# Patient Record
Sex: Male | Born: 1955 | Race: White | Hispanic: No | Marital: Married | Smoking: Never smoker
Health system: Southern US, Community
[De-identification: ages and names within clinical notes are randomized; demographics above are authoritative.]

## PROBLEM LIST (undated history)

## (undated) DIAGNOSIS — R0602 Shortness of breath: Secondary | ICD-10-CM

## (undated) DIAGNOSIS — I10 Essential (primary) hypertension: Secondary | ICD-10-CM

## (undated) DIAGNOSIS — R413 Other amnesia: Secondary | ICD-10-CM

## (undated) DIAGNOSIS — E119 Type 2 diabetes mellitus without complications: Secondary | ICD-10-CM

## (undated) HISTORY — DX: Essential (primary) hypertension: I10

## (undated) HISTORY — PX: KNEE CARTILAGE SURGERY: SHX688

## (undated) HISTORY — DX: Type 2 diabetes mellitus without complications: E11.9

## (undated) HISTORY — DX: Shortness of breath: R06.02

## (undated) HISTORY — DX: Other amnesia: R41.3

---

## 2001-01-14 ENCOUNTER — Inpatient Hospital Stay (HOSPITAL_COMMUNITY): Admission: AD | Admit: 2001-01-14 | Discharge: 2001-01-17 | Payer: Self-pay | Admitting: Neurosurgery

## 2002-11-17 ENCOUNTER — Ambulatory Visit (HOSPITAL_COMMUNITY): Admission: RE | Admit: 2002-11-17 | Discharge: 2002-11-17 | Payer: Self-pay | Admitting: Family Medicine

## 2007-11-18 ENCOUNTER — Ambulatory Visit (HOSPITAL_BASED_OUTPATIENT_CLINIC_OR_DEPARTMENT_OTHER): Admission: RE | Admit: 2007-11-18 | Discharge: 2007-11-18 | Payer: Self-pay | Admitting: Family Medicine

## 2007-12-29 ENCOUNTER — Encounter: Admission: RE | Admit: 2007-12-29 | Discharge: 2007-12-29 | Payer: Self-pay | Admitting: Cardiology

## 2007-12-30 ENCOUNTER — Inpatient Hospital Stay (HOSPITAL_BASED_OUTPATIENT_CLINIC_OR_DEPARTMENT_OTHER): Admission: RE | Admit: 2007-12-30 | Discharge: 2007-12-30 | Payer: Self-pay | Admitting: Cardiology

## 2008-03-25 ENCOUNTER — Encounter: Admission: RE | Admit: 2008-03-25 | Discharge: 2008-03-25 | Payer: Self-pay | Admitting: Family Medicine

## 2008-11-06 ENCOUNTER — Encounter: Admission: RE | Admit: 2008-11-06 | Discharge: 2008-11-06 | Payer: Self-pay | Admitting: Emergency Medicine

## 2009-06-14 ENCOUNTER — Emergency Department (HOSPITAL_COMMUNITY): Admission: EM | Admit: 2009-06-14 | Discharge: 2009-06-14 | Payer: Self-pay | Admitting: Emergency Medicine

## 2009-09-16 ENCOUNTER — Emergency Department (HOSPITAL_COMMUNITY): Admission: EM | Admit: 2009-09-16 | Discharge: 2009-09-16 | Payer: Self-pay | Admitting: Emergency Medicine

## 2010-01-24 ENCOUNTER — Ambulatory Visit (HOSPITAL_COMMUNITY)
Admission: RE | Admit: 2010-01-24 | Discharge: 2010-01-25 | Payer: Self-pay | Source: Home / Self Care | Attending: Urology | Admitting: Urology

## 2010-03-02 ENCOUNTER — Encounter: Payer: Self-pay | Admitting: Family Medicine

## 2010-04-21 LAB — COMPREHENSIVE METABOLIC PANEL WITH GFR
ALT: 28 U/L (ref 0–53)
AST: 24 U/L (ref 0–37)
Albumin: 3.7 g/dL (ref 3.5–5.2)
Alkaline Phosphatase: 81 U/L (ref 39–117)
BUN: 11 mg/dL (ref 6–23)
CO2: 29 meq/L (ref 19–32)
Calcium: 9.1 mg/dL (ref 8.4–10.5)
Chloride: 99 meq/L (ref 96–112)
Creatinine, Ser: 1.07 mg/dL (ref 0.4–1.5)
GFR calc non Af Amer: >60 mL/min
Glucose, Bld: 307 mg/dL — ABNORMAL HIGH (ref 70–99)
Potassium: 4.7 meq/L (ref 3.5–5.1)
Sodium: 138 meq/L (ref 135–145)
Total Bilirubin: 0.7 mg/dL (ref 0.3–1.2)
Total Protein: 7.1 g/dL (ref 6.0–8.3)

## 2010-04-21 LAB — GLUCOSE, CAPILLARY
Glucose-Capillary: 213 mg/dL — ABNORMAL HIGH (ref 70–99)
Glucose-Capillary: 215 mg/dL — ABNORMAL HIGH (ref 70–99)
Glucose-Capillary: 227 mg/dL — ABNORMAL HIGH (ref 70–99)
Glucose-Capillary: 257 mg/dL — ABNORMAL HIGH (ref 70–99)
Glucose-Capillary: 290 mg/dL — ABNORMAL HIGH (ref 70–99)

## 2010-04-21 LAB — SURGICAL PCR SCREEN
MRSA, PCR: NEGATIVE
Staphylococcus aureus: NEGATIVE

## 2010-04-21 LAB — CBC
MCH: 30.3 pg (ref 26.0–34.0)
MCV: 87.9 fL (ref 78.0–100.0)
RBC: 5.12 MIL/uL (ref 4.22–5.81)

## 2010-04-25 LAB — COMPREHENSIVE METABOLIC PANEL
ALT: 32 U/L (ref 0–53)
AST: 28 U/L (ref 0–37)
Alkaline Phosphatase: 86 U/L (ref 39–117)
CO2: 25 mEq/L (ref 19–32)
Creatinine, Ser: 1.06 mg/dL (ref 0.4–1.5)
GFR calc Af Amer: >60 mL/min (ref >60)
Glucose, Bld: 245 mg/dL — ABNORMAL HIGH (ref 70–99)
Total Bilirubin: 0.8 mg/dL (ref 0.3–1.2)

## 2010-04-25 LAB — URINE MICROSCOPIC-ADD ON: Urine-Other: NONE SEEN

## 2010-04-25 LAB — URINALYSIS, ROUTINE W REFLEX MICROSCOPIC
Bilirubin Urine: NEGATIVE
Hgb urine dipstick: NEGATIVE
Ketones, ur: NEGATIVE mg/dL
Leukocytes, UA: NEGATIVE
Nitrite: NEGATIVE
Protein, ur: NEGATIVE mg/dL
Urobilinogen, UA: 0.2 mg/dL (ref 0.0–1.0)
pH: 6 (ref 5.0–8.0)

## 2010-04-25 LAB — DIFFERENTIAL
Basophils Relative: 0 % (ref 0–1)
Lymphocytes Relative: 21 % (ref 12–46)
Monocytes Absolute: 0.4 10*3/uL (ref 0.1–1.0)
Neutro Abs: 2.9 10*3/uL (ref 1.7–7.7)

## 2010-04-25 LAB — GLUCOSE, CAPILLARY: Glucose-Capillary: 248 mg/dL — ABNORMAL HIGH (ref 70–99)

## 2010-04-25 LAB — CBC
MCH: 32.2 pg (ref 26.0–34.0)
RBC: 4.67 MIL/uL (ref 4.22–5.81)

## 2010-04-29 LAB — CK TOTAL AND CKMB (NOT AT ARMC)
CK, MB: 3.3 ng/mL (ref 0.3–4.0)
CK, MB: 3.5 ng/mL (ref 0.3–4.0)
Relative Index: 2.4 (ref 0.0–2.5)

## 2010-04-29 LAB — COMPREHENSIVE METABOLIC PANEL
Albumin: 3.7 g/dL (ref 3.5–5.2)
Alkaline Phosphatase: 77 U/L (ref 39–117)
BUN: 16 mg/dL (ref 6–23)
Chloride: 100 mEq/L (ref 96–112)
Creatinine, Ser: 1.3 mg/dL (ref 0.4–1.5)
GFR calc non Af Amer: 57 mL/min — ABNORMAL LOW (ref >60)
Glucose, Bld: 229 mg/dL — ABNORMAL HIGH (ref 70–99)
Sodium: 137 mEq/L (ref 135–145)
Total Bilirubin: 1 mg/dL (ref 0.3–1.2)
Total Protein: 7.2 g/dL (ref 6.0–8.3)

## 2010-04-29 LAB — CBC
HCT: 45.2 % (ref 39.0–52.0)
Hemoglobin: 15.8 g/dL (ref 13.0–17.0)
MCHC: 34.9 g/dL (ref 30.0–36.0)
RBC: 4.97 MIL/uL (ref 4.22–5.81)
RDW: 13.6 % (ref 11.5–15.5)
WBC: 6.1 10*3/uL (ref 4.0–10.5)

## 2010-04-29 LAB — URINALYSIS, ROUTINE W REFLEX MICROSCOPIC
Hgb urine dipstick: NEGATIVE
Specific Gravity, Urine: 1.046 — ABNORMAL HIGH (ref 1.005–1.030)
pH: 5 (ref 5.0–8.0)

## 2010-04-29 LAB — DIFFERENTIAL
Basophils Absolute: 0 10*3/uL (ref 0.0–0.1)
Eosinophils Absolute: 0.2 10*3/uL (ref 0.0–0.7)
Monocytes Absolute: 0.7 10*3/uL (ref 0.1–1.0)

## 2010-04-29 LAB — RAPID URINE DRUG SCREEN, HOSP PERFORMED
Barbiturates: NOT DETECTED
Benzodiazepines: NOT DETECTED
Cocaine: NOT DETECTED
Tetrahydrocannabinol: NOT DETECTED

## 2010-04-29 LAB — TROPONIN I
Troponin I: 0.01 ng/mL (ref 0.00–0.06)
Troponin I: 0.03 ng/mL (ref 0.00–0.06)

## 2010-04-29 LAB — D-DIMER, QUANTITATIVE: D-Dimer, Quant: 1.07 ug/mL-FEU — ABNORMAL HIGH (ref 0.00–0.48)

## 2010-04-29 LAB — BRAIN NATRIURETIC PEPTIDE: Pro B Natriuretic peptide (BNP): 47 pg/mL (ref 0.0–100.0)

## 2010-06-24 NOTE — Cardiovascular Report (Signed)
Joshua Holland, Joshua Holland                 ACCOUNT NO.:  [id number]   MEDICAL RECORD NO.:  [id number]          PATIENT TYPE:  OIB   LOCATION:  1965                         FACILITY:  [facility]   PHYSICIAN:  Armanda Magic, M.D.     DATE OF BIRTH:  [birth year]   DATE OF PROCEDURE:  12/30/2007  DATE OF DISCHARGE:  12/30/2007                            CARDIAC CATHETERIZATION   REFERRING PHYSICIAN:  Dr. Clemencia Course.   PROCEDURE:  Left heart catheterization, coronary angiography, and left  ventriculography.   OPERATOR:  Armanda Magic, MD   INDICATIONS:  Chest pain.   COMPLICATIONS:  None.   IV MEDICATIONS:  Versed 1 mg and fentanyl 25 mcg.   PROCEDURE:  This is a 55 year old male who presented with episodes of  chest pain and now presents for cardiac catheterization.   The patient was brought to the cardiac catheterization laboratory in the  fasting nonsedated state.  Informed consent was obtained.  The patient  was connected to continuous heart rate and pulse oximetry monitoring and  intermittent blood pressure monitor.  The right groin was prepped and  draped in sterile fashion.  Xylocaine 1% was used for local anesthesia.  Using the modified Seldinger technique, a 4-French sheath was placed in  right femoral artery.  Under fluoroscopic guidance, a 4-French JL-4  catheter was placed in the left coronary artery.  Multiple cine films  were taken at 30-degree RAO and 40-degree LAO views.  This catheter was  then exchanged out over a guidewire for a 4-French 3D RCA catheter,  which successfully engaged the right coronary artery.  Multiple cine  films were taken at 30-degree RAO and 40-degree LAO views.  This  catheter was then exchanged out over a guidewire for a 4-French angled  pigtail catheter, which was placed under fluoroscopic guidance in the  left ventricular cavity.  Left ventriculography was performed in the 30-  degree RAO view using total of 30 mL of contrast at 15 mL per second.  The  catheter was then pulled back across the aortic valve.  No  significant gradient noted.  At the end of procedure, all catheters and  sheaths were removed.  Manual compression was performed, so adequate  hemostasis was obtained.  The patient was transferred back to room in  stable condition.   RESULTS:  1. Left main coronary artery is widely patent, trifurcates into left      anterior descending artery, ramus branch, and left circumflex      artery.   The left anterior descending artery is widely patent throughout its  course.  The apex giving rise to two diagonal branches both of which are  widely patent.   The ramus branch is widely patent and is a moderate-sized vessel.   Left circumflex traverses the AV groove and is widely patent.  It gives  rise to a large obtuse marginal one branch, which is widely patent.   The right coronary artery is widely patent throughout its course and  distally bifurcates into posterior descending artery and posterior  lateral artery, both of which are widely patent.  ASSESSMENT:  1. Normal coronary arteries.  2. Normal left ventricular function.  3. Noncardiac chest pain.   PLAN:  Discharge to home after IV fluid and bedrest.  Follow up with  primary physician for further workup of chest pain.      Armanda Magic, M.D.  Electronically Signed     TT/MEDQ  D:  01/09/2008  T:  01/09/2008  Job:  [id number]   cc:   Scherry Ran

## 2010-06-27 NOTE — Discharge Summary (Signed)
[place]. [facility]  Patient:    Joshua Holland, BUSHWAY Visit Number: [id number] MRN: [id number]          Service Type: MED Location: 3000 3001 01 Attending Physician:  Cristi Loron Dictated by:   Cristi Loron, M.D. Admit Date:  01/14/2001 Discharge Date: 01/17/2001   CC:         Dr. Wilkie Aye at [facility]   Discharge Summary  For full details of this admission, please refer to the typed history and physical.  HISTORY OF PRESENT ILLNESS:  The patient is a 55 year old white male who slipped on the ice on 01/14/01, with a loss of consciousness.  He was eventually seen at [facility].  He was seen by Dr. Wilkie Aye in the emergency department.  Cranial CT scan was obtained which demonstrated a skull fracture, subarachnoid hemorrhage, and subdural hematoma, and the patient was subsequently transferred to [facility] for my further neurosurgical care.  For past medical history, past surgical history, medications, drug allergies, family medical history, social history, admission physical exam, etc., please refer to typed history and physical.  HOSPITAL COURSE:  I admitted the patient to [facility] on 01/14/01, with the diagnosis of a closed head injury, skull fracture, subdural hematoma, subarachnoid hemorrhage.  He was initially monitored in the intensive care unit.  I repeated his CAT scan on 01/15/01, and it demonstrated no change in his hematomas.  He was subsequently transferred to the neurosurgical general floor.  He had some persistent headaches, nausea, and vomiting.  This resolved by 01/17/01, at which time he was afebrile, vital signs were stable, he was eating adequately, and he was neurologically normal.  He was requesting discharge to home.  He was therefore discharged home on 01/17/01.  DISCHARGE MEDICATIONS:  P.r.n. Tylenol for headaches.  FOLLOWUP:  The patient is instructed to follow up  with me in 2 to 3 weeks.  ACTIVITY:  No driving, avoid any situation if he were to have a seizure or pass out that he would injure himself, such as climbing ladders, etc.  He is instructed to call me if he develops any persistent nausea or vomiting, seizures, mental status changes, weakness, etc.  FINAL DIAGNOSES: 1. Closed head injury. 2. Skull fracture. 3. Subarachnoid hemorrhage. 4. Subdural hematoma.  PROCEDURE PERFORMED:  None. Dictated by:   Cristi Loron, M.D. Attending Physician:  Tressie Stalker D DD:  01/17/01 TD:  01/17/01 Job: [id number]

## 2010-06-27 NOTE — H&P (Signed)
[place]. [facility]  Patient:    Joshua Holland, Joshua Holland Visit Number: [id number] MRN: [id number]          Service Type: MED Location: 3000 3001 01 Attending Physician:  Cristi Loron Dictated by:   Cristi Loron, M.D. Admit Date:  01/14/2001 Discharge Date: 01/17/2001   CC:         [facility]. Dione [place]   History and Physical  CHIEF COMPLAINT:  Fall/headache.  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  He was making deliveries.  He was with two other gentleman, and he slipped on the ice and struck his head with loss of consciousness.  This was witnessed by the other two bystanders.  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  He called EMS during one of his stops, and he was transported to [facility] via EMS, where he was evaluated by Dr. Wilkie Aye.  Dr. Cassie Freer evaluation included a cranial CT scan which demonstrated a skull fracture and subdural hematoma, and he was transferred to [facility] for my neurosurgical care.  Presently, the patient complains of some headache, dizziness.  He denies seizures, nausea, vomiting, numbness, tingling, weakness, neck pain, back pain, chest pain, abdominal pain, etc.  As above, he says that this fall was witnessed, and he slipped on the ice.  PAST MEDICAL HISTORY: 1. Hypertension. 2. Cataracts. 3. Orthopedic injuries.  PAST SURGICAL HISTORY: 1. Left knee surgery. 2. Left elbow surgery. 3. Hand surgery. 4. Cataract surgery.  MEDICATIONS PRIOR TO ADMISSION: 1. Atenolol 50 mg p.o. q.d. 2. Celebrex p.o. q.d. p.r.n. 3. Quinine p.r.n. for chronic muscle cramps.  ALLERGIES:  No known drug allergies.  FAMILY HISTORY:  The patients mother is age 38, in fairly good health except for arthritis and coronary artery disease.  The patients father is age 69, also suffers from coronary artery  disease and heart troubles.  SOCIAL HISTORY:  The patient is married.  He has two daughters.  He lives in [place].  He is employed as a Naval architect.  He occasionally drinks alcohol.  Denies Ethanol and drug abuse.  REVIEW OF SYSTEMS:  Negative except as above.  PHYSICAL EXAMINATION:  GENERAL:  Pleasant, well-nourished, well-developed 55 year old white male in no apparent distress.  HEENT:  Some swelling over his forehead in the midline and some tenderness. No other obvious deformities.  Pupils are equal, round, and reactive to light. Extraocular muscles intact.  Oropharynx benign.  Uvula midline.  Sclerae white.  Conjunctivae pink.  There are no Battle signs, raccoon eyes, no evidence of CSF, otorrhea, or rhinorrhea.  NECK:  Supple.  No masses, deformities, tracheal deviations, hyperextension, carotid bruits.  ISI is normal cervical range of motion. Thorax symmetrical.  LUNGS:  Clear to auscultation.  HEART:  Regular rate and rhythm.  ABDOMEN:  Obese, soft, nontender.  EXTREMITIES:  No obvious deformities.  BACK:  No point tenderness, deformities.  NEUROLOGIC:  Alert and oriented x 3.  Cranial nerves II-XII grossly intact. Bilateral vision and hearing grossly normal.  Glasgow coma scale 15.  Motor strength is 5/5.  Bilateral deltoid, biceps, triceps, hand grips, wrist extensors, interosseous psoas, quadriceps, gastrocnemius, extensor hallucis longus.  Deep tendon reflexes are 1-2/4 in bilateral biceps, triceps, brachialis, quadriceps, gastrocnemius.  He has bilateral flexor plantar reflexes.  No ankle clonus.  Sensory exam is grossly normal to light touch in all tested dermatomes bilaterally.  Cerebellar exam is intact in rapid alternating movements of the upper extremities bilaterally.  LABORATORY DATA:  I reviewed the patients cranial CT scan performed in [facility] today without contrast demonstrates that he has a midline to frontal skull  fracture which goes through the left frontal sinus and extends up towards the sagittal sinus.  He has bifrontal subarachnoid hemorrhage and a left interhemispheric subdural with some more subdural out laterally on the left.  There is no significant mass effect.  ASSESSMENT AND PLAN:  Closed head injury, skull fracture, subdural hematoma. I discussed the situation with the patient and his family including his wife and daughters.  I recommended he be admitted to the intensive care unit for close observation, and repeat his CT scan again tomorrow for any further bleeding, or sooner if his neurologic status should change.  I think we should also get neck x-rays given the fact that he fell and suffered a skull fracture, although he is not complaining of any neck pain. Dictated by:   Cristi Loron, M.D. Attending Physician:  Tressie Stalker D DD:  01/14/01 TD:  01/15/01 Job: [id number]

## 2012-08-15 ENCOUNTER — Encounter: Payer: Self-pay | Admitting: Neurology

## 2012-08-15 ENCOUNTER — Ambulatory Visit (INDEPENDENT_AMBULATORY_CARE_PROVIDER_SITE_OTHER): Payer: Managed Care, Other (non HMO) | Admitting: Neurology

## 2012-08-15 VITALS — BP 161/96 | HR 93 | Ht 74.5 in | Wt 326.0 lb

## 2012-08-15 DIAGNOSIS — R0602 Shortness of breath: Secondary | ICD-10-CM

## 2012-08-15 DIAGNOSIS — I10 Essential (primary) hypertension: Secondary | ICD-10-CM

## 2012-08-15 DIAGNOSIS — R413 Other amnesia: Secondary | ICD-10-CM

## 2012-08-15 DIAGNOSIS — E119 Type 2 diabetes mellitus without complications: Secondary | ICD-10-CM | POA: Insufficient documentation

## 2012-08-15 MED ORDER — TOPIRAMATE ER 100 MG PO CAP24: 100.0000 mg | ORAL_CAPSULE | Freq: Every day | ORAL | Status: DC

## 2012-08-15 MED ORDER — TRAMADOL HCL 50 MG PO TABS: 50.0000 mg | ORAL_TABLET | Freq: Four times a day (QID) | ORAL | Status: DC | PRN

## 2012-08-15 NOTE — Progress Notes (Signed)
[facility]  PATIENT: Joshua Holland DOB: 1955-10-13  HISTORICAL Joshua Holland is a 57 years old right-handed Caucasian male, accompanied by his wife, referred by his primary care physician Dr. Antony Haste for evaluation of memory loss  In 2002, he slipped and fell on black ice, landed on his occipital region, lost consciousness for one hour, was transferred to [facility] Emergency room, was told to have intracranial bleeding, he began to have constant headache ever since, he also lost his sense of smell, and taste, he had intermittent blurry vision,   Over years, he has become more sa sedentary lifestyle, gained weight, he can only walk short distance, has to sit down, he also complains of memory trouble, difficulty finding his way when driving around, he complains of achy headaches, each time lasts about 2-3 hours, couple episodes a day, bifrontal pressure headaches, he has been taking combination of ibuprofen and Tylenol,    he has been sleeping in recliner since the accident, he snoring a lot, stop breathing in the middle of snoring, he complains of excessive daytime fatigue, sleepiness, today's ESS score is pain, assess score is 41,    REVIEW OF SYSTEMS: Full 14 system review of systems performed and notable only for weight gain, chest pain, hearing loss, ringing ears, spinning sensation, blurry vision, shortness of breath, snoring, constipation, easy bruising, easy bleeding, and feeling hot, feeding thirst, joints pain, achy muscles, cramps, memory loss, confusion, headaches, dizziness, snoring, restless legs   ALLERGIES: No Known Allergies   PAST MEDICAL HISTORY: Past Medical History  Diagnosis Date  . Memory loss   . Diabetes mellitus  since 2011  . High blood pressure   . Shortness of breath     PAST SURGICAL HISTORY: No past surgical history on file.  FAMILY HISTORY: Family History  Problem Relation Age of Onset  . Alzheimer's disease Mother 38,  started at age 52s.  . Cancer Brother     In remission    SOCIAL HISTORY: He graduated from high school, currently unemployed since 2008, he used to drive truck for 25 years.  Social History  . Marital Status: Married    Spouse Name: N/A    Number of Children: N/A  . Years of Education: N/A   Occupational History  . Not on file.   Social History Main Topics  . Smoking status: Never Smoker   . Smokeless tobacco: Never Used  . Alcohol Use: No  . Drug Use: No  . Sexually Active: Not on file    PHYSICAL EXAM    Filed Vitals:   08/15/12 1025  BP: 161/96  Pulse: 93  Height: 6' 2.5" (1.892 m)  Weight: 326 lb (147.873 kg)     Body mass index is 41.31 kg/(m^2).   Generalized: In no acute distress  Neck: Supple, no carotid bruits   Cardiac: Regular rate rhythm  Pulmonary: Clear to auscultation bilaterally  Musculoskeletal: No deformity  Neurological examination  Mentation: Alert oriented to time, place, history taking, and causual conversation, obese  Cranial nerve II-XII: Pupils were equal round reactive to light extraocular movements were full, visual field were full on confrontational test. facial sensation and strength were normal. hearing was intact to finger rubbing bilaterally. Uvula tongue midline.  head turning and shoulder shrug and were normal and symmetric.Tongue protrusion into cheek strength was normal.  Motor: normal tone, bulk and strength.  Sensory: Intact to fine touch, pinprick, preserved vibratory sensation, and proprioception at toes.  Coordination: Normal  finger to nose, heel-to-shin bilaterally there was no truncal ataxia  Gait: Rising up from seated position without assistance, normal stance, cautious gait.  Romberg signs: Negative  Deep tendon reflexes: Brachioradialis 2/2, biceps 2/2, triceps 2/2, patellar 2/2, Achilles 2/2, plantar responses were flexor bilaterally.   DIAGNOSTIC DATA (LABS, IMAGING, TESTING) - I reviewed patient  records, labs, notes, testing and imaging myself where available.  Lab Results  Component Value Date   WBC 4.9 01/23/2010   HGB 15.5 01/23/2010   HCT 45.0 01/23/2010   MCV 87.9 01/23/2010   PLT 173 01/23/2010      Component Value Date/Time   NA 138 01/23/2010 1330   K 4.7 01/23/2010 1330   CL 99 01/23/2010 1330   CO2 29 01/23/2010 1330   GLUCOSE 307* 01/23/2010 1330   BUN 11 01/23/2010 1330   CREATININE 1.07 01/23/2010 1330   CALCIUM 9.1 01/23/2010 1330   PROT 7.1 01/23/2010 1330   ALBUMIN 3.7 01/23/2010 1330   AST 24 01/23/2010 1330   ALT 28 01/23/2010 1330   ALKPHOS 81 01/23/2010 1330   BILITOT 0.7 01/23/2010 1330   GFRNONAA >60 01/23/2010 1330   GFRAA  Value: >60        The eGFR has been calculated using the MDRD equation. This calculation has not been validated in all clinical situations. eGFR's persistently <60 mL/min signify possible Chronic Kidney Disease. 01/23/2010 1330    ASSESSMENT AND PLAN  57 years old right-handed Caucasian male, with past medical history of head trauma, obesity, diabetes, hypertension, presenting with gradual worsening memory trouble, frequent headaches, symptoms of obstructive sleep apnea,  1. His memory trouble are likely a combination of previous head trauma, deconditioning, obesity. 2. MRI of brain. 3. laboratory evaluation 4. his ESS score is 10, FSS score is 41, will proceed with sleep study.  5. RTC in 2 months     Levert Feinstein. M.D. Ph.D.  [facility] [address] [place], [place] [address] [phone]

## 2012-08-16 LAB — COMPREHENSIVE METABOLIC PANEL
ALT: 30 IU/L (ref 0–44)
AST: 24 IU/L (ref 0–40)
AST: 24 IU/L (ref 0–40)
Albumin: 4.3 g/dL (ref 3.5–5.5)
Alkaline Phosphatase: 83 IU/L (ref 39–117)
BUN/Creatinine Ratio: 14 (ref 9–20)
BUN: 13 mg/dL (ref 6–24)
CO2: 22 mmol/L (ref 18–29)
Calcium: 9.3 mg/dL (ref 8.7–10.2)
Chloride: 98 mmol/L (ref 97–108)
Creatinine, Ser: 0.9 mg/dL (ref 0.76–1.27)
GFR calc Af Amer: 108 mL/min/{1.73_m2} (ref >59)
Globulin, Total: 2.7 g/dL (ref 1.5–4.5)
Glucose: 286 mg/dL — ABNORMAL HIGH (ref 65–99)
Potassium: 4.4 mmol/L (ref 3.5–5.2)
Sodium: 137 mmol/L (ref 134–144)
Sodium: 136 mmol/L (ref 134–144)
Total Bilirubin: 0.6 mg/dL (ref 0.0–1.2)
Total Bilirubin: 0.5 mg/dL (ref 0.0–1.2)
Total Protein: 6.7 g/dL (ref 6.0–8.5)

## 2012-08-16 LAB — C-REACTIVE PROTEIN: CRP: 1.4 mg/L (ref 0.0–4.9)

## 2012-08-16 LAB — CBC
MCH: 29.9 pg (ref 26.6–33.0)
MCHC: 34.4 g/dL (ref 31.5–35.7)
MCV: 87 fL (ref 79–97)
Platelets: 181 10*3/uL (ref 150–379)
RBC: 5.46 x10E6/uL (ref 4.14–5.80)
RDW: 14.4 % (ref 12.3–15.4)

## 2012-08-16 LAB — RPR: RPR: NONREACTIVE

## 2012-08-16 LAB — FOLATE: Folate: >19.9 ng/mL (ref >3.0)

## 2012-08-16 LAB — TSH: TSH: 1.81 u[IU]/mL (ref 0.450–4.500)

## 2012-08-16 NOTE — Progress Notes (Signed)
Quick Note:  Please call patient, lab showed elevated glucose, rest was normal, she needs better control of her DM, ______

## 2012-08-17 NOTE — Progress Notes (Signed)
Quick Note:  Spoke with patient and relayed results of blood work. Patient understood and had no questions.  ______ 

## 2012-08-30 ENCOUNTER — Other Ambulatory Visit: Payer: Self-pay

## 2012-08-31 ENCOUNTER — Other Ambulatory Visit: Payer: Self-pay

## 2012-09-08 ENCOUNTER — Ambulatory Visit
Admission: RE | Admit: 2012-09-08 | Discharge: 2012-09-08 | Disposition: A | Discharge status: Home / Self Care | Payer: Managed Care, Other (non HMO) | Source: Ambulatory Visit | Attending: Neurology | Admitting: Neurology

## 2012-09-08 DIAGNOSIS — R0602 Shortness of breath: Secondary | ICD-10-CM

## 2012-09-08 DIAGNOSIS — I10 Essential (primary) hypertension: Secondary | ICD-10-CM

## 2012-09-08 DIAGNOSIS — E119 Type 2 diabetes mellitus without complications: Secondary | ICD-10-CM

## 2012-09-08 DIAGNOSIS — R413 Other amnesia: Secondary | ICD-10-CM

## 2012-09-14 ENCOUNTER — Ambulatory Visit
Admission: RE | Admit: 2012-09-14 | Discharge: 2012-09-14 | Disposition: A | Discharge status: Home / Self Care | Payer: Managed Care, Other (non HMO) | Source: Ambulatory Visit | Attending: Neurology | Admitting: Neurology

## 2012-09-14 DIAGNOSIS — R413 Other amnesia: Secondary | ICD-10-CM

## 2012-09-23 ENCOUNTER — Telehealth: Payer: Self-pay | Admitting: Neurology

## 2012-09-23 DIAGNOSIS — G4733 Obstructive sleep apnea (adult) (pediatric): Secondary | ICD-10-CM

## 2012-09-23 NOTE — Telephone Encounter (Signed)
HST ordered, thx sa

## 2012-09-23 NOTE — Telephone Encounter (Signed)
. °  Dr. Levert Feinstein is referring Joshua Holland, 57 y.o. male, for the evaluation of sleep apnea.  Wt: 326 lbs. Ht: 74.4 BMI: 41.31  Diagnoses: Excessive Daytime Sleepiness Morbid Obesity Witnessed Apnea Snoring Fatigue HTN Diabetes Headache Memory Loss  Medication List: Current Outpatient Prescriptions  Medication Sig Dispense Refill   amLODipine (NORVASC) 10 MG tablet Take 10 mg by mouth daily.       glipiZIDE (GLUCOTROL) 10 MG tablet Take 10 mg by mouth 2 (two) times daily before a meal.       losartan (COZAAR) 100 MG tablet Take 100 mg by mouth daily.       Topiramate ER (TROKENDI XR) 100 MG CP24 Take 100 mg by mouth daily at 8 pm.  30 capsule  12   traMADol (ULTRAM) 50 MG tablet Take 1 tablet (50 mg total) by mouth every 6 (six) hours as needed for pain.  60 tablet  6   No current facility-administered medications for this visit.    Dr. Levert Feinstein is referring this patient for the evaluation of sleep apnea.  The patient reports snoring and apneic events.  He complains of excessive daytime sleepiness and daytime fatigue.  He endorses Epworth at 10.  Insurance:  [employer] - Attended sleep study was not approved.  Home sleep test was approved.

## 2012-10-04 ENCOUNTER — Telehealth: Payer: Self-pay | Admitting: *Deleted

## 2012-10-04 NOTE — Telephone Encounter (Signed)
Called patient but voicemail box has not been setup yet, was unable to leave message will try again later. -sh

## 2012-10-04 NOTE — Telephone Encounter (Signed)
Message copied by Daryll Drown on Tue Oct 04, 2012  8:28 AM ------      Message from: [facility]      Created: Mon Sep 26, 2012  9:46 AM      Regarding: HST Setup       Please contact the patient for HST setup.  Coverage is 100% - $25 copayment applies ------

## 2012-10-07 NOTE — Telephone Encounter (Signed)
Called the patient to setup consultation with Dr. Frances Furbish, he has declined citing that he cannot afford the additional copayments.  Advised the patient that I would send his concerns to Dr. Terrace Arabia.

## 2012-10-17 ENCOUNTER — Ambulatory Visit: Payer: Managed Care, Other (non HMO) | Admitting: Neurology

## 2013-02-09 HISTORY — PX: CARDIOVERSION: SHX1299

## 2013-03-16 ENCOUNTER — Emergency Department: Payer: Self-pay | Admitting: Emergency Medicine

## 2013-09-28 ENCOUNTER — Encounter (HOSPITAL_COMMUNITY): Payer: Self-pay | Admitting: Pharmacy Technician

## 2013-10-10 ENCOUNTER — Ambulatory Visit (HOSPITAL_COMMUNITY): Payer: Managed Care, Other (non HMO) | Admitting: Certified Registered"

## 2013-10-10 ENCOUNTER — Encounter (HOSPITAL_COMMUNITY): Payer: Managed Care, Other (non HMO) | Admitting: Certified Registered"

## 2013-10-10 ENCOUNTER — Ambulatory Visit (HOSPITAL_COMMUNITY)
Admission: RE | Admit: 2013-10-10 | Discharge: 2013-10-10 | Disposition: A | Discharge status: Home / Self Care | Payer: Managed Care, Other (non HMO) | Source: Ambulatory Visit | Attending: Cardiology | Admitting: Cardiology

## 2013-10-10 ENCOUNTER — Encounter (HOSPITAL_COMMUNITY)
Admission: RE | Disposition: A | Discharge status: Home / Self Care | Payer: Managed Care, Other (non HMO) | Source: Ambulatory Visit | Attending: Cardiology

## 2013-10-10 ENCOUNTER — Encounter (HOSPITAL_COMMUNITY): Payer: Self-pay

## 2013-10-10 DIAGNOSIS — I4891 Unspecified atrial fibrillation: Secondary | ICD-10-CM | POA: Insufficient documentation

## 2013-10-10 HISTORY — PX: CARDIOVERSION: SHX1299

## 2013-10-10 LAB — GLUCOSE, CAPILLARY: Glucose-Capillary: 304 mg/dL — ABNORMAL HIGH (ref 70–99)

## 2013-10-10 SURGERY — CARDIOVERSION
Anesthesia: General

## 2013-10-10 MED ORDER — APIXABAN 5 MG PO TABS
5.0000 mg | ORAL_TABLET | Freq: Once | ORAL | Status: AC
  Administered 2013-10-10: 5 mg via ORAL
  Filled 2013-10-10: qty 1

## 2013-10-10 MED ORDER — LIDOCAINE HCL (CARDIAC) 20 MG/ML IV SOLN
INTRAVENOUS | Status: DC | PRN
  Administered 2013-10-10: 40 mg via INTRAVENOUS

## 2013-10-10 MED ORDER — PROPOFOL 10 MG/ML IV BOLUS
INTRAVENOUS | Status: DC | PRN
  Administered 2013-10-10: 90 mg via INTRAVENOUS

## 2013-10-10 MED ORDER — LACTATED RINGERS IV SOLN
INTRAVENOUS | Status: DC
  Administered 2013-10-10: 12:00:00 via INTRAVENOUS

## 2013-10-10 MED ORDER — SODIUM CHLORIDE 0.9 % IV SOLN
INTRAVENOUS | Status: DC
  Administered 2013-10-10: 12:00:00 via INTRAVENOUS

## 2013-10-10 MED ORDER — METOPROLOL TARTRATE 1 MG/ML IV SOLN
INTRAVENOUS | Status: AC
  Filled 2013-10-10: qty 5

## 2013-10-10 NOTE — CV Procedure (Signed)
Direct current cardioversion:  Indication symptomatic A. Fibrillation.  Procedure: Using 90 mg of IV Propofol and 40 IV Lidocaine (for reducing venous pain) for achieving deep sedation, synchronized direct current cardioversion performed. Patient was delivered with 120 Joules of electricity X 1 with success to NSR. Patient tolerated the procedure well. No immediate complication noted.

## 2013-10-10 NOTE — Interval H&P Note (Signed)
History and Physical Interval Note:  10/10/2013 1:00 PM  Joshua Holland  has presented today for surgery, with the diagnosis of A FIB   The various methods of treatment have been discussed with the patient and family. After consideration of risks, benefits and other options for treatment, the patient has consented to  Procedure(s) with comments: CARDIOVERSION (N/A) - H&P in file as a surgical intervention .  The patient's history has been reviewed, patient examined, no change in status, stable for surgery.  I have reviewed the patient's chart and labs.  Questions were answered to the patient's satisfaction.     Laverda Page

## 2013-10-10 NOTE — Discharge Instructions (Signed)

## 2013-10-10 NOTE — Transfer of Care (Signed)
Immediate Anesthesia Transfer of Care Note  Patient: Joshua Holland  Procedure(s) Performed: Procedure(s) with comments: CARDIOVERSION (N/A) - H&P in file  Patient Location: Endoscopy Unit  Anesthesia Type:General  Level of Consciousness: awake  Airway & Oxygen Therapy: Patient Spontanous Breathing and Patient connected to nasal cannula oxygen  Post-op Assessment: Report given to PACU RN, Post -op Vital signs reviewed and stable and Patient moving all extremities  Post vital signs: Reviewed and stable  Complications: No apparent anesthesia complications

## 2013-10-10 NOTE — H&P (Signed)
  Please see office visit notes for complete details of HPI.  

## 2013-10-10 NOTE — Anesthesia Postprocedure Evaluation (Signed)
  Anesthesia Post-op Note  Patient: Joshua Holland  Procedure(s) Performed: Procedure(s) with comments: CARDIOVERSION (N/A) - H&P in file  Patient Location: PACU  Anesthesia Type:General  Level of Consciousness: awake, alert , oriented and patient cooperative  Airway and Oxygen Therapy: Patient Spontanous Breathing  Post-op Pain: none  Post-op Assessment: Post-op Vital signs reviewed, Patient's Cardiovascular Status Stable, Respiratory Function Stable, Patent Airway and No signs of Nausea or vomiting  Post-op Vital Signs: stable  Last Vitals:  Filed Vitals:   10/10/13 1330  BP: 183/99  Pulse: 71  Temp:   Resp: 20    Complications: No apparent anesthesia complications

## 2013-10-10 NOTE — Anesthesia Preprocedure Evaluation (Addendum)
Anesthesia Evaluation  Patient identified by MRN, date of birth, ID band Patient awake    Reviewed: Allergy & Precautions, H&P , NPO status , Patient's Chart, lab work & pertinent test results, reviewed documented beta blocker date and time   Airway Mallampati: III TM Distance: >3 FB Neck ROM: Full    Dental  (+) Poor Dentition, Dental Advisory Given   Pulmonary shortness of breath,          Cardiovascular hypertension, Pt. on medications and Pt. on home beta blockers     Neuro/Psych    GI/Hepatic   Endo/Other  diabetes, Well Controlled, Type 2, Oral Hypoglycemic Agents  Renal/GU      Musculoskeletal   Abdominal   Peds  Hematology   Anesthesia Other Findings   Reproductive/Obstetrics                          Anesthesia Physical Anesthesia Plan  ASA: III  Anesthesia Plan: General   Post-op Pain Management:    Induction: Intravenous  Airway Management Planned: Mask  Additional Equipment: None  Intra-op Plan:   Post-operative Plan: Extubation in OR  Informed Consent: I have reviewed the patients History and Physical, chart, labs and discussed the procedure including the risks, benefits and alternatives for the proposed anesthesia with the patient or authorized representative who has indicated his/her understanding and acceptance.   Dental advisory given  Plan Discussed with: CRNA, Anesthesiologist and Surgeon  Anesthesia Plan Comments:         Anesthesia Quick Evaluation

## 2013-10-11 ENCOUNTER — Encounter (HOSPITAL_COMMUNITY): Payer: Self-pay | Admitting: Cardiology

## 2014-10-17 ENCOUNTER — Telehealth: Payer: Self-pay | Admitting: Cardiovascular Disease

## 2014-10-17 NOTE — Telephone Encounter (Signed)
Received records from [facility] - Dr Melford Aase- for appointment with Dr Oval Linsey on 11/05/14.  Records given to [facility] (medical records) for Dr Blenda Mounts schedule. lp

## 2014-11-05 ENCOUNTER — Ambulatory Visit: Payer: Managed Care, Other (non HMO) | Admitting: Cardiovascular Disease

## 2015-04-01 DIAGNOSIS — Z8572 Personal history of non-Hodgkin lymphomas: Secondary | ICD-10-CM | POA: Diagnosis not present

## 2015-04-01 DIAGNOSIS — C187 Malignant neoplasm of sigmoid colon: Secondary | ICD-10-CM | POA: Diagnosis not present

## 2015-04-22 DIAGNOSIS — C189 Malignant neoplasm of colon, unspecified: Secondary | ICD-10-CM | POA: Diagnosis not present

## 2015-05-06 DIAGNOSIS — R0602 Shortness of breath: Secondary | ICD-10-CM | POA: Diagnosis not present

## 2015-05-06 DIAGNOSIS — I4891 Unspecified atrial fibrillation: Secondary | ICD-10-CM | POA: Diagnosis not present

## 2015-05-06 DIAGNOSIS — C189 Malignant neoplasm of colon, unspecified: Secondary | ICD-10-CM | POA: Diagnosis not present

## 2015-05-06 DIAGNOSIS — G629 Polyneuropathy, unspecified: Secondary | ICD-10-CM | POA: Diagnosis not present

## 2017-05-21 ENCOUNTER — Emergency Department (HOSPITAL_COMMUNITY)
Admission: EM | Admit: 2017-05-21 | Discharge: 2017-05-21 | Disposition: A | Discharge status: Home / Self Care | Payer: Managed Care, Other (non HMO) | Attending: Emergency Medicine | Admitting: Emergency Medicine

## 2017-05-21 ENCOUNTER — Emergency Department (HOSPITAL_COMMUNITY): Payer: Managed Care, Other (non HMO)

## 2017-05-21 ENCOUNTER — Encounter (HOSPITAL_COMMUNITY): Payer: Self-pay | Admitting: *Deleted

## 2017-05-21 DIAGNOSIS — E119 Type 2 diabetes mellitus without complications: Secondary | ICD-10-CM | POA: Insufficient documentation

## 2017-05-21 DIAGNOSIS — Z7901 Long term (current) use of anticoagulants: Secondary | ICD-10-CM | POA: Diagnosis not present

## 2017-05-21 DIAGNOSIS — Z87820 Personal history of traumatic brain injury: Secondary | ICD-10-CM | POA: Diagnosis not present

## 2017-05-21 DIAGNOSIS — Z79899 Other long term (current) drug therapy: Secondary | ICD-10-CM | POA: Insufficient documentation

## 2017-05-21 DIAGNOSIS — R410 Disorientation, unspecified: Secondary | ICD-10-CM | POA: Diagnosis present

## 2017-05-21 DIAGNOSIS — N289 Disorder of kidney and ureter, unspecified: Secondary | ICD-10-CM | POA: Diagnosis not present

## 2017-05-21 DIAGNOSIS — G4700 Insomnia, unspecified: Secondary | ICD-10-CM | POA: Diagnosis not present

## 2017-05-21 LAB — DIFFERENTIAL
BASOS ABS: 0.1 10*3/uL (ref 0.0–0.1)
Basophils Relative: 1 %
Eosinophils Absolute: 1.6 10*3/uL — ABNORMAL HIGH (ref 0.0–0.7)
Eosinophils Relative: 21 %
LYMPHS ABS: 1 10*3/uL (ref 0.7–4.0)
LYMPHS PCT: 13 %
Monocytes Absolute: 0.3 10*3/uL (ref 0.1–1.0)
Monocytes Relative: 4 %
NEUTROS ABS: 4.7 10*3/uL (ref 1.7–7.7)
NEUTROS PCT: 61 %

## 2017-05-21 LAB — CBC
HCT: 37.6 % — ABNORMAL LOW (ref 39.0–52.0)
HEMOGLOBIN: 12 g/dL — AB (ref 13.0–17.0)
MCH: 28.6 pg (ref 26.0–34.0)
MCHC: 31.9 g/dL (ref 30.0–36.0)
MCV: 89.7 fL (ref 78.0–100.0)
PLATELETS: 242 10*3/uL (ref 150–400)
RBC: 4.19 MIL/uL — ABNORMAL LOW (ref 4.22–5.81)
RDW: 16.9 % — ABNORMAL HIGH (ref 11.5–15.5)
WBC: 7.6 10*3/uL (ref 4.0–10.5)

## 2017-05-21 LAB — I-STAT CHEM 8, ED
BUN: 19 mg/dL (ref 6–20)
CALCIUM ION: 1.06 mmol/L — AB (ref 1.15–1.40)
CHLORIDE: 104 mmol/L (ref 101–111)
CREATININE: 1.7 mg/dL — AB (ref 0.61–1.24)
Glucose, Bld: 120 mg/dL — ABNORMAL HIGH (ref 65–99)
HEMATOCRIT: 39 % (ref 39.0–52.0)
Hemoglobin: 13.3 g/dL (ref 13.0–17.0)
Potassium: 4.1 mmol/L (ref 3.5–5.1)
SODIUM: 140 mmol/L (ref 135–145)
TCO2: 24 mmol/L (ref 22–32)

## 2017-05-21 LAB — COMPREHENSIVE METABOLIC PANEL
ALBUMIN: 4 g/dL (ref 3.5–5.0)
ALK PHOS: 133 U/L — AB (ref 38–126)
ALT: 16 U/L — ABNORMAL LOW (ref 17–63)
AST: 18 U/L (ref 15–41)
Anion gap: 12 (ref 5–15)
BILIRUBIN TOTAL: 1.9 mg/dL — AB (ref 0.3–1.2)
BUN: 18 mg/dL (ref 6–20)
CALCIUM: 8.7 mg/dL — AB (ref 8.9–10.3)
CO2: 22 mmol/L (ref 22–32)
Chloride: 105 mmol/L (ref 101–111)
Creatinine, Ser: 1.69 mg/dL — ABNORMAL HIGH (ref 0.61–1.24)
GFR calc Af Amer: 48 mL/min — ABNORMAL LOW (ref >60)
GFR calc non Af Amer: 41 mL/min — ABNORMAL LOW (ref >60)
GLUCOSE: 123 mg/dL — AB (ref 65–99)
Potassium: 4.2 mmol/L (ref 3.5–5.1)
SODIUM: 139 mmol/L (ref 135–145)
TOTAL PROTEIN: 7.6 g/dL (ref 6.5–8.1)

## 2017-05-21 LAB — DIGOXIN LEVEL: Digoxin Level: <0.2 ng/mL — ABNORMAL LOW (ref 0.8–2.0)

## 2017-05-21 LAB — URINALYSIS, ROUTINE W REFLEX MICROSCOPIC
BILIRUBIN URINE: NEGATIVE
Glucose, UA: NEGATIVE mg/dL
Hgb urine dipstick: NEGATIVE
Ketones, ur: NEGATIVE mg/dL
Leukocytes, UA: NEGATIVE
NITRITE: NEGATIVE
PH: 5 (ref 5.0–8.0)
Protein, ur: NEGATIVE mg/dL
SPECIFIC GRAVITY, URINE: 1.008 (ref 1.005–1.030)

## 2017-05-21 LAB — PROTIME-INR
INR: 1.19
Prothrombin Time: 15 seconds (ref 11.4–15.2)

## 2017-05-21 LAB — I-STAT TROPONIN, ED: Troponin i, poc: 0 ng/mL (ref 0.00–0.08)

## 2017-05-21 LAB — APTT: APTT: 31 s (ref 24–36)

## 2017-05-21 NOTE — ED Provider Notes (Signed)
[place] EMERGENCY DEPARTMENT Provider Note   CSN: [id number] Arrival date & time: 05/21/17  0830     History   Chief Complaint Chief Complaint  Patient presents with  . Altered Mental Status    HPI Joshua Holland is a 62 y.o. male.  62 year old male presents with acute confusion which began around midnight according to the wife.  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 and drove to the office at around midnight and waited there all night for his morning appointment.  Patient states that he knows that this was the wrong thing to do.  He denies any recent illnesses.  No fever, vomiting.  Patient denies any medication changes.  He has been struggling with insomnia.  Denies any alcohol or drug use.  He does have a prior history of a closed head injury.  No headache or neck pain.  Patient states he feels at his baseline at this time.     Past Medical History:  Diagnosis Date  . Diabetes mellitus ([place])   . High blood pressure   . Memory loss   . Shortness of breath     Patient Active Problem List   Diagnosis Date Noted  . Memory loss   . Diabetes ([place])   . High blood pressure   . Shortness of breath     Past Surgical History:  Procedure Laterality Date  . CARDIOVERSION  02/2013  . CARDIOVERSION N/A 10/10/2013   Procedure: CARDIOVERSION;  Surgeon: Laverda Page, MD;  Location: [facility];  Service: Cardiovascular;  Laterality: N/A;  H&P in file  . KNEE CARTILAGE SURGERY Left "late 42s"        Home Medications    Prior to Admission medications   Medication Sig Start Date End Date Taking? Authorizing Provider  apixaban (ELIQUIS) 5 MG TABS tablet Take 5 mg by mouth 2 (two) times daily.    [provider]  digoxin (LANOXIN) 0.25 MG tablet Take 0.25 mg by mouth daily.    [provider]  diltiazem (CARDIZEM CD) 180 MG 24 hr capsule Take 180 mg by mouth daily.    [provider]  metoprolol tartrate (LOPRESSOR)  25 MG tablet Take 50 mg by mouth 2 (two) times daily.    [provider]    Family History Family History  Problem Relation Age of Onset  . Alzheimer's disease Mother   . Cancer Brother        In remission    Social History Social History   Tobacco Use  . Smoking status: Never Smoker  . Smokeless tobacco: Never Used  Substance Use Topics  . Alcohol use: No  . Drug use: No     Allergies   Patient has no known allergies.   Review of Systems Review of Systems  All other systems reviewed and are negative.    Physical Exam Updated Vital Signs BP (!) 178/89 (BP Location: Left Arm)   Pulse 76   Temp (!) 97.3 F (36.3 C) (Oral)   Resp 16   SpO2 100%   Physical Exam  Constitutional: He is oriented to person, place, and time. He appears well-developed and well-nourished.  Non-toxic appearance. No distress.  HENT:  Head: Normocephalic and atraumatic.  Eyes: Pupils are equal, round, and reactive to light. Conjunctivae, EOM and lids are normal.  Neck: Normal range of motion. Neck supple. No tracheal deviation present. No thyroid mass present.  Cardiovascular: Normal rate, regular rhythm and normal  heart sounds. Exam reveals no gallop.  No murmur heard. Pulmonary/Chest: Effort normal and breath sounds normal. No stridor. No respiratory distress. He has no decreased breath sounds. He has no wheezes. He has no rhonchi. He has no rales.  Abdominal: Soft. Normal appearance and bowel sounds are normal. He exhibits no distension. There is no tenderness. There is no rebound and no CVA tenderness.  Musculoskeletal: Normal range of motion. He exhibits no edema or tenderness.  Neurological: He is alert and oriented to person, place, and time. He has normal strength. No cranial nerve deficit or sensory deficit. He displays a negative Romberg sign. Coordination normal. GCS eye subscore is 4. GCS verbal subscore is 5. GCS motor subscore is 6.  Skin: Skin is warm and dry. No  abrasion and no rash noted.  Psychiatric: He has a normal mood and affect. His speech is normal and behavior is normal.  Nursing note and vitals reviewed.    ED Treatments / Results  Labs (all labs ordered are listed, but only abnormal results are displayed) Labs Reviewed  CBC - Abnormal; Notable for the following components:      Result Value   RBC 4.19 (*)    Hemoglobin 12.0 (*)    HCT 37.6 (*)    RDW 16.9 (*)    All other components within normal limits  DIFFERENTIAL - Abnormal; Notable for the following components:   Eosinophils Absolute 1.6 (*)    All other components within normal limits  COMPREHENSIVE METABOLIC PANEL - Abnormal; Notable for the following components:   Glucose, Bld 123 (*)    Creatinine, Ser 1.69 (*)    Calcium 8.7 (*)    ALT 16 (*)    Alkaline Phosphatase 133 (*)    Total Bilirubin 1.9 (*)    GFR calc non Af Amer 41 (*)    GFR calc Af Amer 48 (*)    All other components within normal limits  I-STAT CHEM 8, ED - Abnormal; Notable for the following components:   Creatinine, Ser 1.70 (*)    Glucose, Bld 120 (*)    Calcium, Ion 1.06 (*)    All other components within normal limits  PROTIME-INR  APTT  DIGOXIN LEVEL  I-STAT TROPONIN, ED  CBG MONITORING, ED    EKG EKG Interpretation  Date/Time:  Friday May 21 2017 08:34:50 EDT Ventricular Rate:  91 PR Interval:    QRS Duration: 154 QT Interval:  454 QTC Calculation: 558 R Axis:   -39 Text Interpretation:  Ventricular-paced rhythm with occasional supraventricular complexes and with occasional and consecutive Premature ventricular complexes Abnormal ECG Confirmed by Lacretia Leigh ([id number]) on 05/21/2017 10:10:57 AM   Radiology Ct Head Wo Contrast  Result Date: 05/21/2017 CLINICAL DATA:  Abnormal behavior, unexplained.  Confusion. EXAM: CT HEAD WITHOUT CONTRAST TECHNIQUE: Contiguous axial images were obtained from the base of the skull through the vertex without intravenous contrast. COMPARISON:   MR brain 09/08/2012. CT head 09/16/2009. FINDINGS: Brain: No evidence for acute stroke, acute hemorrhage, mass lesion, or extra-axial fluid. Generalized atrophy, with hydrocephalus ex vacuo. Bifrontal encephalomalacia, greater on the LEFT, likely old trauma. Vascular: No hyperdense vessel or unexpected calcification. Skull: Normal. Negative for fracture or focal lesion. Sinuses/Orbits: No acute finding. Other: None. Compared with priors, similar appearance. IMPRESSION: Atrophy, bifrontal encephalomalacia similar to priors. No acute intracranial findings. Electronically Signed   By: Staci Righter M.D.   On: 05/21/2017 09:14    Procedures Procedures (including critical care time)  Medications Ordered in  ED Medications - No data to display   Initial Impression / Assessment and Plan / ED Course  I have reviewed the triage vital signs and the nursing notes.  Pertinent labs & imaging results that were available during my care of the patient were reviewed by me and considered in my medical decision making (see chart for details).     Patient's head CT without acute findings.  He has no signs of infection in his urine.  Does have evidence of renal insufficiency.  Patient has a subtherapeutic digoxin level.  Suspect some of his symptoms may be from a prior head injury.  He does not have any acute neurological findings at this time.  Do not feel that he needs to have an MRI of his brain at this time.  Will discharge to home and encouraged him to follow-up with his doctor for his worsening renal function.  Final Clinical Impressions(s) / ED Diagnoses   Final diagnoses:  None    ED Discharge Orders    None       Lacretia Leigh, MD 05/21/17 1301

## 2017-05-21 NOTE — ED Notes (Signed)
Sitting upright at bedside with family in room - awake, alert, no c/o pain or other discomfort at this time; attempted to draw blood from existing IV site to [facility] - unable to flush line nor pull blood from site

## 2017-05-21 NOTE — ED Triage Notes (Signed)
CBG 130 

## 2017-05-21 NOTE — ED Notes (Signed)
Pt pacing hallways and room. States he wants to leave. Advised that we are still awaiting results from Digoxin levels from lab. States he will stay 15 more minutes and leave AMA.

## 2017-05-21 NOTE — ED Triage Notes (Addendum)
Pt in via EMS to triage- states that pt left the house last night and went and sat in his doctors office all night waiting for his AM appointment, this behavior is not normal for him, wife reports intermittent confusion and slurred speech, pt alerted on arrival, not answering questions appropriately- thought his wife was his mother, answered year incorrectly for EMS, pt alert on arrival- unknown LSN

## 2017-05-21 NOTE — ED Notes (Signed)
Wife reports these episodes of increased confusion have been going on since pt had a stroke in February, but episodes are becoming more frequent and worsening

## 2017-05-21 NOTE — Discharge Instructions (Signed)
Your creatinine today was 1.7.  This is elevated above normal and requires you to follow-up with your doctor for this.

## 2017-09-16 ENCOUNTER — Other Ambulatory Visit: Payer: Self-pay

## 2017-09-16 ENCOUNTER — Emergency Department (HOSPITAL_COMMUNITY): Payer: Managed Care, Other (non HMO)

## 2017-09-16 ENCOUNTER — Encounter (HOSPITAL_COMMUNITY): Payer: Self-pay

## 2017-09-16 ENCOUNTER — Emergency Department (HOSPITAL_COMMUNITY)
Admission: EM | Admit: 2017-09-16 | Discharge: 2017-09-17 | Disposition: A | Discharge status: Home / Self Care | Payer: Managed Care, Other (non HMO) | Attending: Emergency Medicine | Admitting: Emergency Medicine

## 2017-09-16 DIAGNOSIS — Z79899 Other long term (current) drug therapy: Secondary | ICD-10-CM | POA: Insufficient documentation

## 2017-09-16 DIAGNOSIS — R404 Transient alteration of awareness: Secondary | ICD-10-CM

## 2017-09-16 DIAGNOSIS — R4182 Altered mental status, unspecified: Secondary | ICD-10-CM | POA: Insufficient documentation

## 2017-09-16 DIAGNOSIS — E119 Type 2 diabetes mellitus without complications: Secondary | ICD-10-CM | POA: Diagnosis not present

## 2017-09-16 DIAGNOSIS — Z87820 Personal history of traumatic brain injury: Secondary | ICD-10-CM | POA: Diagnosis not present

## 2017-09-16 DIAGNOSIS — R4781 Slurred speech: Secondary | ICD-10-CM | POA: Insufficient documentation

## 2017-09-16 DIAGNOSIS — R569 Unspecified convulsions: Secondary | ICD-10-CM | POA: Diagnosis not present

## 2017-09-16 DIAGNOSIS — I10 Essential (primary) hypertension: Secondary | ICD-10-CM | POA: Diagnosis not present

## 2017-09-16 DIAGNOSIS — R55 Syncope and collapse: Secondary | ICD-10-CM

## 2017-09-16 DIAGNOSIS — Z794 Long term (current) use of insulin: Secondary | ICD-10-CM | POA: Diagnosis not present

## 2017-09-16 DIAGNOSIS — Z7901 Long term (current) use of anticoagulants: Secondary | ICD-10-CM | POA: Diagnosis not present

## 2017-09-16 LAB — COMPREHENSIVE METABOLIC PANEL
ALK PHOS: 112 U/L (ref 38–126)
ALT: 17 U/L (ref 0–44)
AST: 21 U/L (ref 15–41)
Albumin: 3.8 g/dL (ref 3.5–5.0)
Anion gap: 9 (ref 5–15)
BUN: 16 mg/dL (ref 8–23)
CALCIUM: 8.7 mg/dL — AB (ref 8.9–10.3)
CHLORIDE: 105 mmol/L (ref 98–111)
CO2: 26 mmol/L (ref 22–32)
CREATININE: 1.64 mg/dL — AB (ref 0.61–1.24)
GFR calc Af Amer: 49 mL/min — ABNORMAL LOW (ref >60)
GFR, EST NON AFRICAN AMERICAN: 43 mL/min — AB (ref >60)
Glucose, Bld: 95 mg/dL (ref 70–99)
Potassium: 3.8 mmol/L (ref 3.5–5.1)
Sodium: 140 mmol/L (ref 135–145)
Total Bilirubin: 2.5 mg/dL — ABNORMAL HIGH (ref 0.3–1.2)
Total Protein: 6.9 g/dL (ref 6.5–8.1)

## 2017-09-16 LAB — CBC
HEMATOCRIT: 39.8 % (ref 39.0–52.0)
HEMOGLOBIN: 12.7 g/dL — AB (ref 13.0–17.0)
MCH: 30 pg (ref 26.0–34.0)
MCHC: 31.9 g/dL (ref 30.0–36.0)
MCV: 93.9 fL (ref 78.0–100.0)
Platelets: 200 10*3/uL (ref 150–400)
RBC: 4.24 MIL/uL (ref 4.22–5.81)
RDW: 16.5 % — ABNORMAL HIGH (ref 11.5–15.5)
WBC: 6.7 10*3/uL (ref 4.0–10.5)

## 2017-09-16 LAB — I-STAT CHEM 8, ED
BUN: 21 mg/dL (ref 8–23)
CALCIUM ION: 1.02 mmol/L — AB (ref 1.15–1.40)
CHLORIDE: 104 mmol/L (ref 98–111)
CREATININE: 1.7 mg/dL — AB (ref 0.61–1.24)
GLUCOSE: 89 mg/dL (ref 70–99)
HCT: 40 % (ref 39.0–52.0)
Hemoglobin: 13.6 g/dL (ref 13.0–17.0)
Potassium: 4 mmol/L (ref 3.5–5.1)
Sodium: 141 mmol/L (ref 135–145)
TCO2: 26 mmol/L (ref 22–32)

## 2017-09-16 LAB — DIFFERENTIAL
ABS IMMATURE GRANULOCYTES: 0 10*3/uL (ref 0.0–0.1)
BASOS PCT: 1 %
Basophils Absolute: 0.1 10*3/uL (ref 0.0–0.1)
Eosinophils Absolute: 0.4 10*3/uL (ref 0.0–0.7)
Eosinophils Relative: 6 %
Immature Granulocytes: 0 %
LYMPHS PCT: 9 %
Lymphs Abs: 0.6 10*3/uL — ABNORMAL LOW (ref 0.7–4.0)
MONOS PCT: 10 %
Monocytes Absolute: 0.6 10*3/uL (ref 0.1–1.0)
NEUTROS ABS: 4.9 10*3/uL (ref 1.7–7.7)
NEUTROS PCT: 74 %

## 2017-09-16 LAB — PROTIME-INR
INR: 1.13
Prothrombin Time: 14.4 seconds (ref 11.4–15.2)

## 2017-09-16 LAB — I-STAT TROPONIN, ED: TROPONIN I, POC: 0.03 ng/mL (ref 0.00–0.08)

## 2017-09-16 LAB — APTT: aPTT: 33 seconds (ref 24–36)

## 2017-09-16 NOTE — ED Triage Notes (Signed)
Pt BIB [facility] d/t int'l fall. When EMS got there, Family stated Pt had slurred speech but improved. Pt has Hx pf TBI, Stroke, Pacemaker, Dementia.

## 2017-09-17 LAB — URINALYSIS, ROUTINE W REFLEX MICROSCOPIC
Bilirubin Urine: NEGATIVE
Glucose, UA: NEGATIVE mg/dL
HGB URINE DIPSTICK: NEGATIVE
Ketones, ur: NEGATIVE mg/dL
Leukocytes, UA: NEGATIVE
NITRITE: NEGATIVE
PH: 6 (ref 5.0–8.0)
Protein, ur: NEGATIVE mg/dL
SPECIFIC GRAVITY, URINE: 1.013 (ref 1.005–1.030)

## 2017-09-17 LAB — RAPID URINE DRUG SCREEN, HOSP PERFORMED
Amphetamines: NOT DETECTED
BENZODIAZEPINES: NOT DETECTED
Barbiturates: NOT DETECTED
Cocaine: NOT DETECTED
OPIATES: NOT DETECTED
Tetrahydrocannabinol: NOT DETECTED

## 2017-09-17 NOTE — ED Provider Notes (Signed)
[place] EMERGENCY DEPARTMENT Provider Note   CSN: [id number] Arrival date & time: 09/16/17  2237     History   Chief Complaint Chief Complaint  Patient presents with  . Stroke Symptoms  . Fall    HPI Joshua Holland is a 62 y.o. male.  HPI Patient is a 62 year old male who presents to the emergency department with his wife after she noticed a fall this evening.  They were getting out of the car and he became stiff and seemed to be somewhat blank in his face and then he fell down and struck his head against the wheelchair.  No reported loss of consciousness.  No history of seizures.  He was somewhat confused and had some transient slurred speech and since arriving in the emergency department his speech is normalized.  History complicated by history of dementia for the patient.  He is a diabetic.  He is on Eliquis.  No recent change in his medications.  History of traumatic brain injury approximately 15 years ago.  He is been worked up for seizures before in the past but was never found to have seizure-like activity.  He was transiently on antiepileptic medications but these were stopped by the patient's wife secondary to aggressive behavior.  No other history is able to be obtained from the patient at this time.  Wife reports no recent fever or chills.  Wife reports appetite is been normal and otherwise he was having a normal day up to this point.   Past Medical History:  Diagnosis Date  . Diabetes mellitus ([place])   . High blood pressure   . Memory loss   . Shortness of breath     Patient Active Problem List   Diagnosis Date Noted  . Memory loss   . Diabetes ([place])   . High blood pressure   . Shortness of breath     Past Surgical History:  Procedure Laterality Date  . CARDIOVERSION  02/2013  . CARDIOVERSION N/A 10/10/2013   Procedure: CARDIOVERSION;  Surgeon: Laverda Page, MD;  Location: [facility];  Service: Cardiovascular;  Laterality: N/A;  H&P in  file  . KNEE CARTILAGE SURGERY Left "late 31s"        Home Medications    Prior to Admission medications   Medication Sig Start Date End Date Taking? Authorizing Provider  apixaban (ELIQUIS) 5 MG TABS tablet Take 5 mg by mouth 2 (two) times daily.   Yes [provider]  D3-50 50000 units capsule Take 50,000 Units by mouth every 30 (thirty) days. 05/07/17  Yes [provider]  digoxin (LANOXIN) 0.25 MG tablet Take 0.25 mg by mouth daily.   Yes [provider]  diltiazem (CARDIZEM CD) 180 MG 24 hr capsule Take 180 mg by mouth daily.   Yes [provider]  DULoxetine (CYMBALTA) 30 MG capsule Take 30 mg by mouth daily. 12/28/16  Yes [provider]  hydrALAZINE (APRESOLINE) 25 MG tablet Take 25 mg by mouth every 8 (eight) hours. 04/16/17  Yes [provider]  Insulin Detemir (LEVEMIR FLEXTOUCH) 100 UNIT/ML Pen Inject 90 Units into the skin at bedtime.  04/15/15  Yes [provider]  metoprolol tartrate (LOPRESSOR) 25 MG tablet Take 50 mg by mouth 2 (two) times daily.   Yes [provider]  Naproxen Sodium (ALEVE) 220 MG CAPS Take 220 mg by mouth as needed (prn pain).   Yes [provider]  vitamin B-12 (CYANOCOBALAMIN) 1000 MCG tablet Take  1,000 mcg by mouth daily. 04/17/17  Yes [provider]    Family History Family History  Problem Relation Age of Onset  . Alzheimer's disease Mother   . Cancer Brother        In remission    Social History Social History   Tobacco Use  . Smoking status: Never Smoker  . Smokeless tobacco: Never Used  Substance Use Topics  . Alcohol use: No  . Drug use: No     Allergies   Lisinopril   Review of Systems Review of Systems  Unable to perform ROS: Dementia     Physical Exam Updated Vital Signs BP (!) 214/114   Pulse 76   Temp 98.3 F (36.8 C) (Oral)   Resp (!) 21   Ht 6\' 3"  (1.905 m)   Wt 134.3 kg   SpO2 96%   BMI 37.00 kg/m   Physical Exam    Constitutional: He appears well-developed and well-nourished.  HENT:  Head: Normocephalic and atraumatic.  Eyes: Pupils are equal, round, and reactive to light. EOM are normal.  Neck: Normal range of motion.  Cardiovascular: Normal rate, regular rhythm and normal heart sounds.  Pulmonary/Chest: Effort normal and breath sounds normal. No respiratory distress.  Abdominal: Soft. He exhibits no distension. There is no tenderness.  Musculoskeletal: Normal range of motion.  Neurological: He is alert.  5/5 strength in major muscle groups of  bilateral upper and lower extremities. Speech normal. No facial asymetry.   Skin: Skin is warm and dry.  Psychiatric: He has a normal mood and affect. Judgment normal.  Nursing note and vitals reviewed.    ED Treatments / Results  Labs (all labs ordered are listed, but only abnormal results are displayed) Labs Reviewed  CBC - Abnormal; Notable for the following components:      Result Value   Hemoglobin 12.7 (*)    RDW 16.5 (*)    All other components within normal limits  DIFFERENTIAL - Abnormal; Notable for the following components:   Lymphs Abs 0.6 (*)    All other components within normal limits  COMPREHENSIVE METABOLIC PANEL - Abnormal; Notable for the following components:   Creatinine, Ser 1.64 (*)    Calcium 8.7 (*)    Total Bilirubin 2.5 (*)    GFR calc non Af Amer 43 (*)    GFR calc Af Amer 49 (*)    All other components within normal limits  I-STAT CHEM 8, ED - Abnormal; Notable for the following components:   Creatinine, Ser 1.70 (*)    Calcium, Ion 1.02 (*)    All other components within normal limits  PROTIME-INR  APTT  RAPID URINE DRUG SCREEN, HOSP PERFORMED  URINALYSIS, ROUTINE W REFLEX MICROSCOPIC  ETHANOL  I-STAT TROPONIN, ED    EKG EKG Interpretation  Date/Time:  Thursday September 16 2017 22:30:16 EDT Ventricular Rate:  77 PR Interval:    QRS Duration: 162 QT Interval:  458 QTC Calculation: 518 R  Axis:   60 Text Interpretation:  Ventricular-paced rhythm Abnormal ECG No significant change was found Confirmed by Jola Schmidt [phone]) on 09/17/2017 1:42:13 AM   Radiology Ct Head Wo Contrast  Result Date: 09/17/2017 CLINICAL DATA:  Difficulty with speech EXAM: CT HEAD WITHOUT CONTRAST TECHNIQUE: Contiguous axial images were obtained from the base of the skull through the vertex without intravenous contrast. COMPARISON:  05/21/2017 FINDINGS: Brain: Chronic bifrontal encephalomalacia, left greater than right. Mild superficial and moderate central atrophy is redemonstrated without acute intracranial hemorrhage.  Chronic small vessel ischemic disease of periventricular white matter is noted. Cavum septum pellucidum et vergae and anatomic variant is seen. No intra-axial mass nor extra-axial fluid collections. Vascular: No hyperdense vessel or unexpected calcification. Skull: Normal. Negative for fracture or focal lesion. Sinuses/Orbits: No acute finding.  Bilateral lens replacements. Other: None. IMPRESSION: Atrophy with chronic bifrontal encephalomalacia, left greater than right. No acute intracranial abnormality. Chronic small vessel ischemic disease of the brain. Electronically Signed   By: Ashley Royalty M.D.   On: 09/17/2017 00:40    Procedures Procedures (including critical care time)  Medications Ordered in ED Medications - No data to display   Initial Impression / Assessment and Plan / ED Course  I have reviewed the triage vital signs and the nursing notes.  Pertinent labs & imaging results that were available during my care of the patient were reviewed by me and considered in my medical decision making (see chart for details).     Return to baseline mental status at this time.  May represent seizure.  Patient will follow up with his neurology team as an outpatient.  No other acute abnormalities found on labs or head CT.  Moving all 4 extremities equally.  Full range of motion of major  joints.  Discharged home in the care of his wife at this time.  Patient's wife is instructed to return the patient to the emergency department for any new or worsening symptoms  Final Clinical Impressions(s) / ED Diagnoses   Final diagnoses:  None    ED Discharge Orders    None       Jola Schmidt, MD 09/17/17 [phone]

## 2018-04-10 DEATH — deceased

## 2018-10-17 IMAGING — CT CT HEAD W/O CM
3 series · 15 of 47 positions shown, 18 images · non-contrast
Comparison: 05/21/2017

CLINICAL DATA: Difficulty with speech

EXAM:
CT HEAD WITHOUT CONTRAST
TECHNIQUE: Contiguous axial images were obtained from the base of the skull
through the vertex without intravenous contrast.

[Series 2: head 5.0 h30s · axial · 0.49mm/px · z∈[-104,+41]mm · 9 of 35 slices shown, 12 images]
[im 3/35  brain]
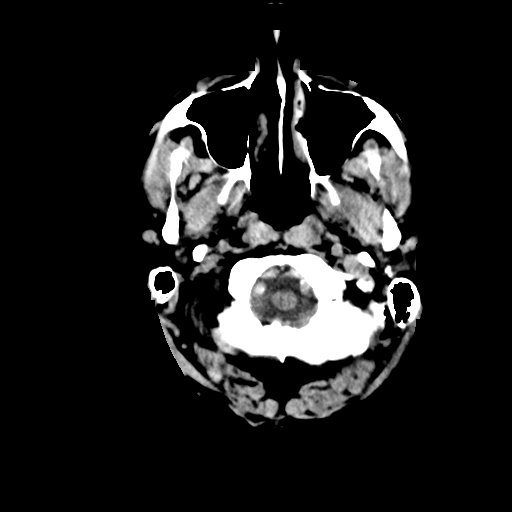
[im 3/35  bone]
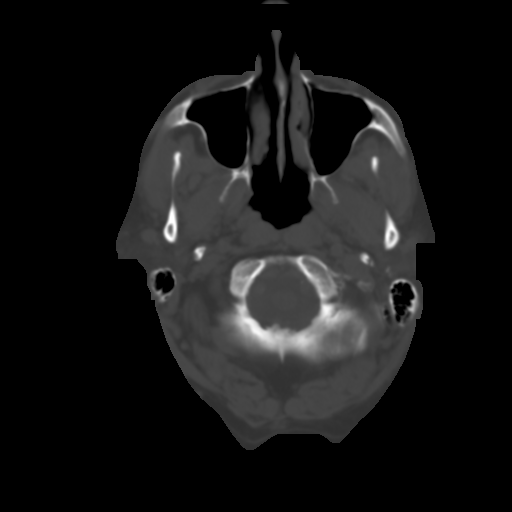
[im 6/35  brain]
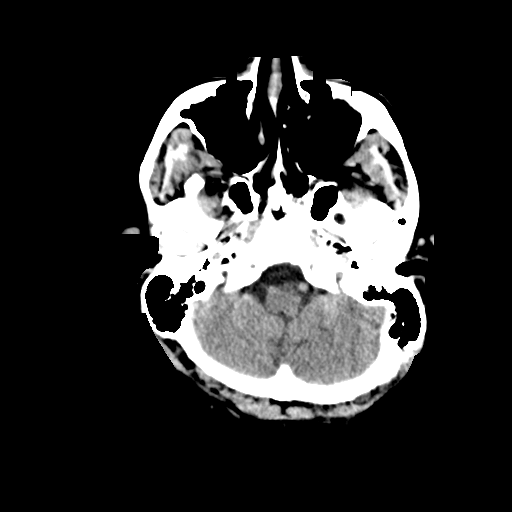
[im 10/35  brain]
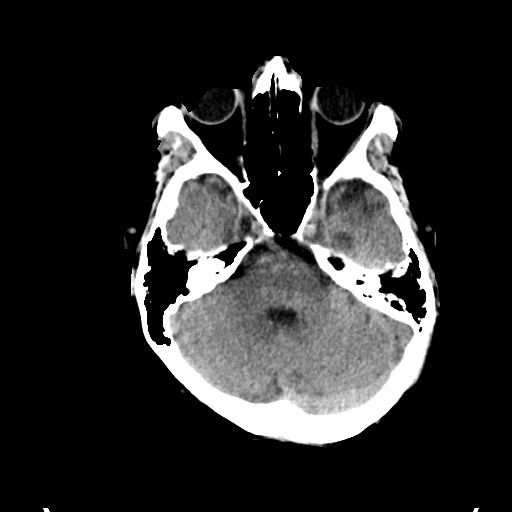
[im 13/35  brain]
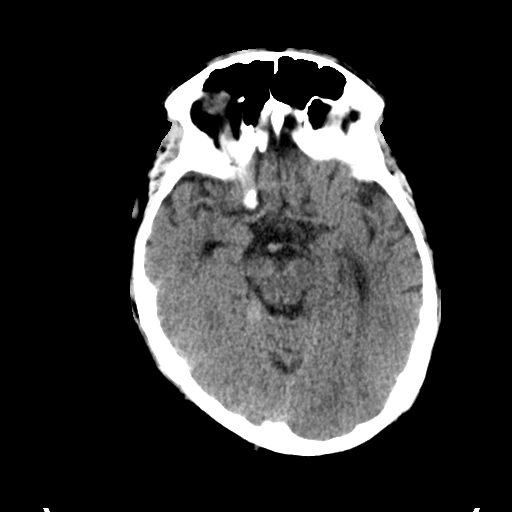
[im 18/35  brain]
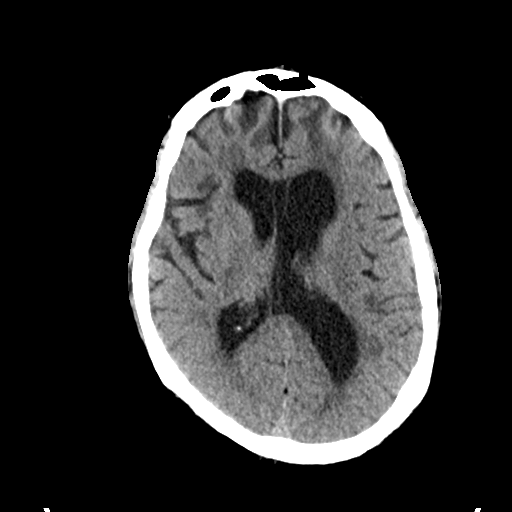
[im 18/35  bone]
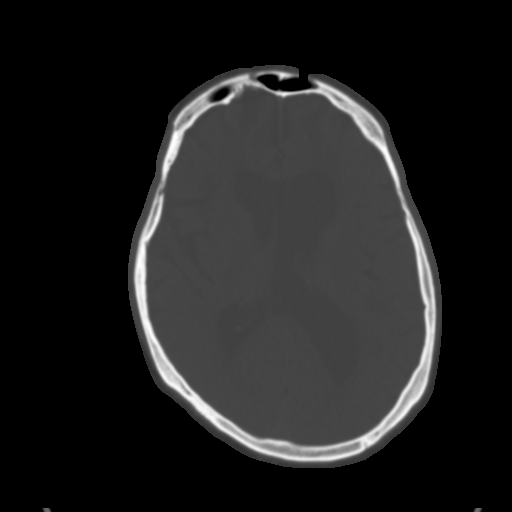
[im 22/35  brain]
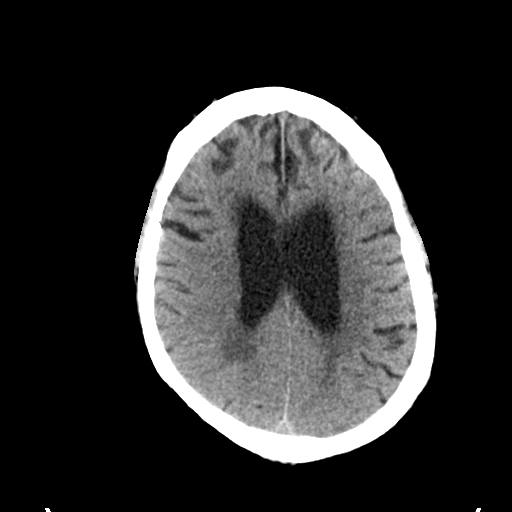
[im 25/35  brain]
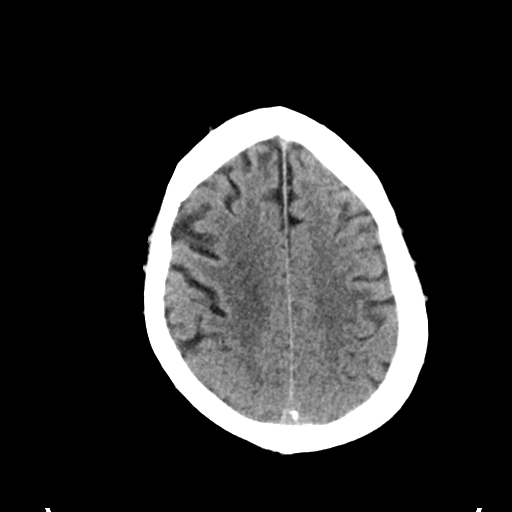
[im 29/35  brain]
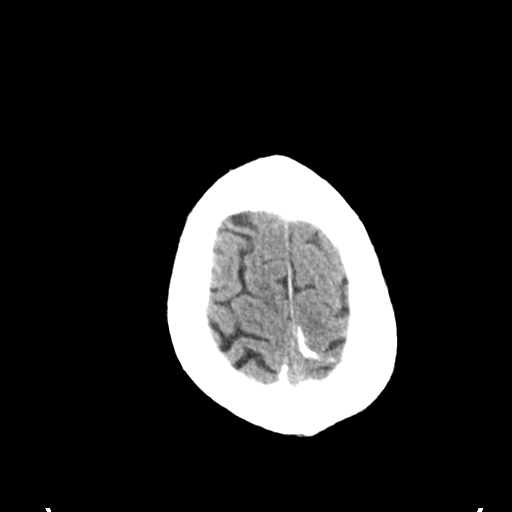
[im 32/35  brain]
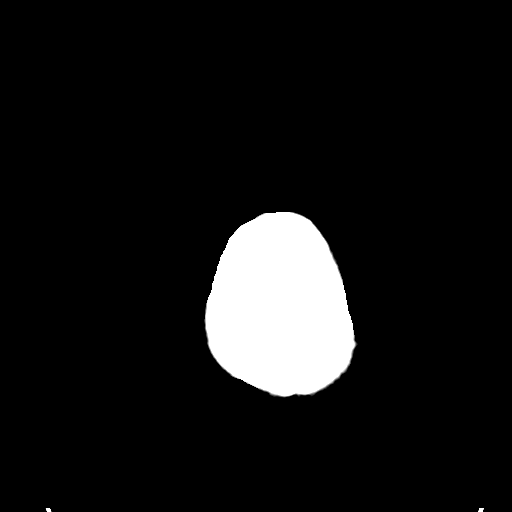
[im 32/35  bone]
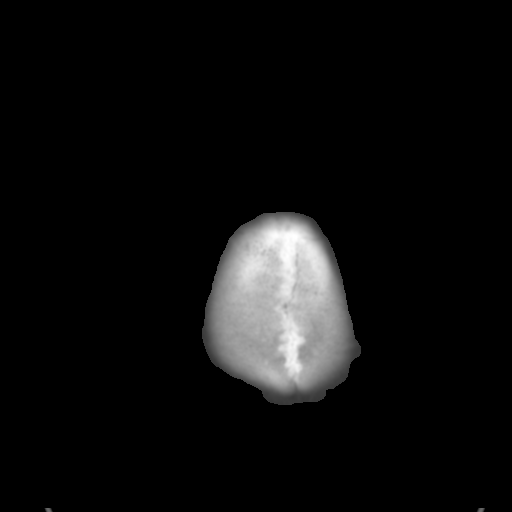

[Series 4: head 3.0 mpr cor · coronal · 0.34mm/px · 3 of 67 slices shown]
[im 23/67  brain]
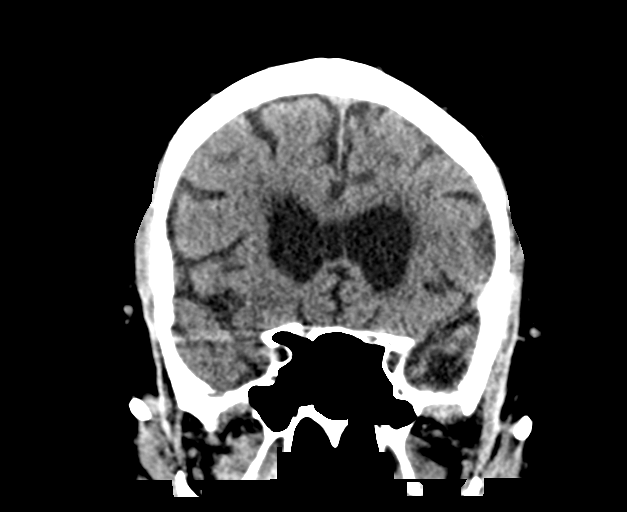
[im 30/67  brain]
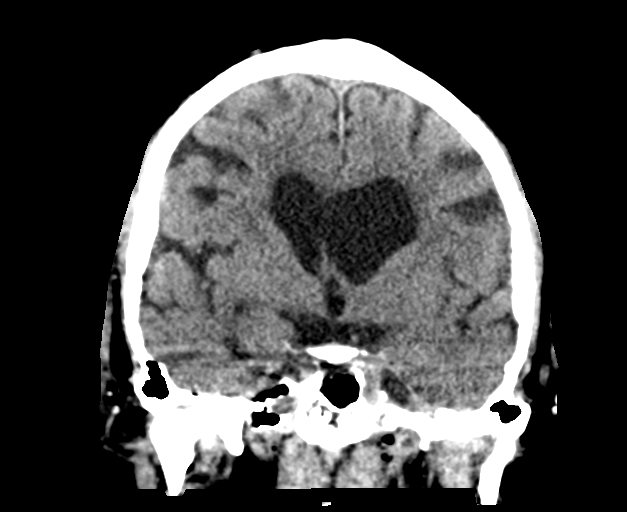
[im 37/67  brain]
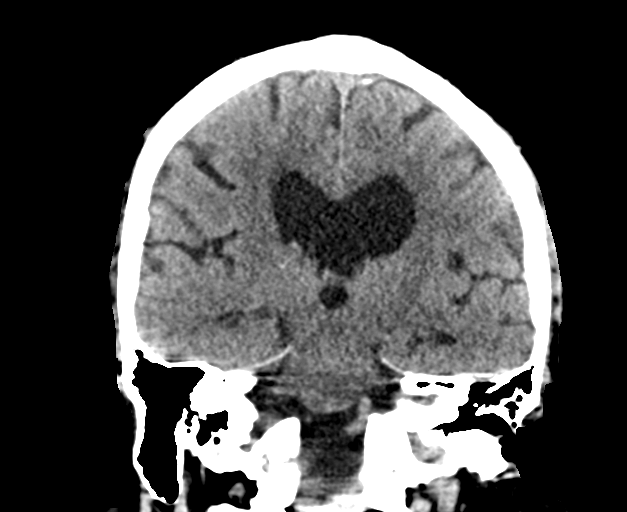

[Series 5: head 3.0 mpr sag · sagittal · 0.34mm/px · 3 of 63 slices shown]
[im 21/63  brain]
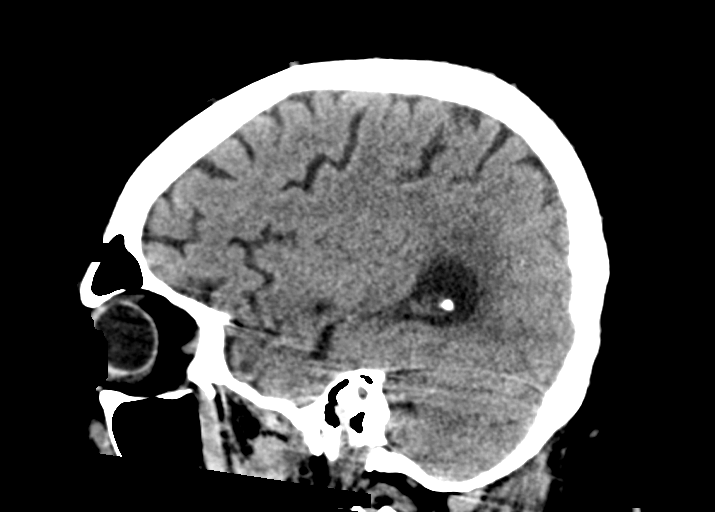
[im 32/63  brain]
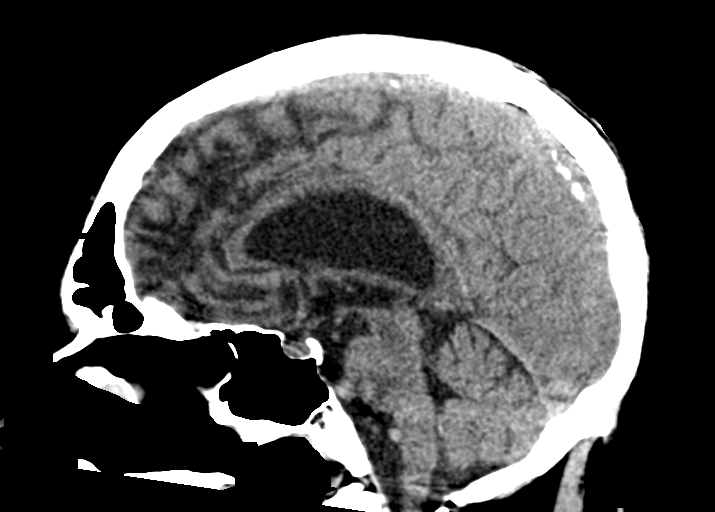
[im 42/63  brain]
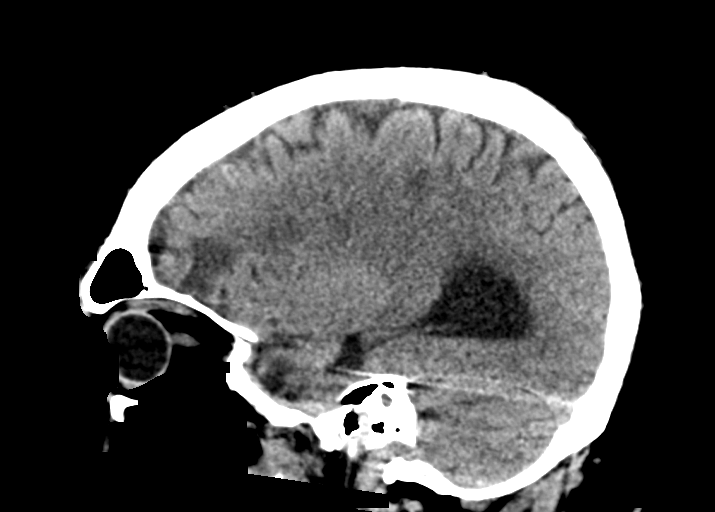

[15 of 47 positions shown; findings below may reference images not displayed]

FINDINGS: Brain: Chronic bifrontal encephalomalacia, left greater than right.
Mild superficial and moderate central atrophy is redemonstrated
without acute intracranial hemorrhage. Chronic small vessel ischemic
disease of periventricular white matter is noted. Cavum septum
pellucidum et vergae and anatomic variant is seen. No intra-axial
mass nor extra-axial fluid collections.

Vascular: No hyperdense vessel or unexpected calcification.

Skull: Normal. Negative for fracture or focal lesion.

Sinuses/Orbits: No acute finding.  Bilateral lens replacements.

Other: None.
IMPRESSION: Atrophy with chronic bifrontal encephalomalacia, left greater than
right. No acute intracranial abnormality. Chronic small vessel
ischemic disease of the brain.
